# Patient Record
Sex: Male | Born: 2000 | Race: White | Hispanic: No | Marital: Single | Smoking: Never smoker
Health system: Southern US, Community
[De-identification: ages and names within clinical notes are randomized; demographics above are authoritative.]

## PROBLEM LIST (undated history)

## (undated) DIAGNOSIS — S39012A Strain of muscle, fascia and tendon of lower back, initial encounter: Secondary | ICD-10-CM

---
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
## 2023-07-18 ENCOUNTER — Emergency Department: Admit: 2023-07-18 | Payer: PRIVATE HEALTH INSURANCE

## 2023-07-18 ENCOUNTER — Inpatient Hospital Stay
Admit: 2023-07-18 | Discharge: 2023-07-18 | Disposition: A | Discharge status: Home / Self Care | Payer: PRIVATE HEALTH INSURANCE

## 2023-07-18 DIAGNOSIS — S39012A Strain of muscle, fascia and tendon of lower back, initial encounter: Secondary | ICD-10-CM

## 2023-07-18 MED ORDER — CYCLOBENZAPRINE HCL 10 MG PO TABS
10 MG | ORAL_TABLET | Freq: Three times a day (TID) | ORAL | 0 refills | Status: AC | PRN
Start: 2023-07-18 — End: ?

## 2023-07-18 MED ORDER — METHYLPREDNISOLONE ACETATE 80 MG/ML IJ SUSP
80 | Freq: Once | INTRAMUSCULAR | Status: AC
Start: 2023-07-18 — End: 2023-07-18
  Administered 2023-07-18: 14:00:00 80 mg via INTRAMUSCULAR

## 2023-07-18 MED FILL — DEPO-MEDROL 80 MG/ML IJ SUSP: 80 MG/ML | INTRAMUSCULAR | Qty: 1

## 2023-07-18 NOTE — ED Provider Notes (Signed)
[facility]  Urgent Care Encounter      CHIEF COMPLAINT       Chief Complaint   Patient presents with    Back Pain       Nurses Notes reviewed and I agree except as noted in the HPI.  HISTORY OF PRESENT ILLNESS   Raymond Jefferson is a 22 y.o. male who presents to urgent care with complaints of left lower back pain.  Patient reports he has had back issues for approximately almost 2 months and reports he has been going to the chiropractor.  Patient reports back pain did improve and he does take Tylenol and ibuprofen occasionally.  Patient reports approximately 4 to 5 days ago he was moving into an apartment and lifting heavy objects when he felt a pull in his left lower back.  Patient reports he believes he is having a flareup.  Denies loss of bowel or bladder control.  Patient reports occasionally he will have spasms or radiation of pain down his left leg.    REVIEW OF SYSTEMS     Review of Systems   Constitutional:  Negative for fatigue and fever.   Genitourinary:  Negative for dysuria and flank pain.   Musculoskeletal:  Positive for back pain.   Neurological:  Negative for dizziness.       PAST MEDICAL HISTORY   History reviewed. No pertinent past medical history.    SURGICAL HISTORY     Patient  has no past surgical history on file.    CURRENT MEDICATIONS       Previous Medications    No medications on file       ALLERGIES     Patient is has No Known Allergies.    FAMILY HISTORY     Patient'sfamily history is not on file.    SOCIAL HISTORY     Patient      PHYSICAL EXAM     ED TRIAGE VITALS  BP: (!) 140/85, Temp: 98 F (36.7 C), Pulse: 55, Respirations: 16, SpO2: 97 %  Physical Exam  Vitals and nursing note reviewed.   Constitutional:       Appearance: Normal appearance.   HENT:      Head: Normocephalic and atraumatic.   Cardiovascular:      Rate and Rhythm: Normal rate.   Pulmonary:      Effort: Pulmonary effort is normal.      Breath sounds: Normal breath sounds.   Musculoskeletal:       Lumbar back: Spasms and tenderness present.   Skin:     General: Skin is warm and dry.      Capillary Refill: Capillary refill takes less than 2 seconds.   Neurological:      General: No focal deficit present.      Mental Status: He is alert.         DIAGNOSTIC RESULTS   Labs:No results found for this visit on 07/18/23.    IMAGING:  XR LUMBAR SPINE (MIN 4 VIEWS)   Final Result    Normal lumbar spine.               **This report has been created using voice recognition software. It may contain   minor errors which are inherent in voice recognition technology.**         Electronically signed by Dr. Sherre Poot         URGENT CARE COURSE:     Vitals:    07/18/23  0957   BP: (!) 140/85   Pulse: 55   Resp: 16   Temp: 98 F (36.7 C)   SpO2: 97%       Medications   methylPREDNISolone acetate (DEPO-MEDROL) injection 80 mg (80 mg IntraMUSCular Given 07/18/23 1023)     PROCEDURES:  None  FINAL IMPRESSION      1. Strain of lumbar region, initial encounter        DISPOSITION/PLAN   DISPOSITION Decision To Discharge 07/18/2023 10:22:56 AM    X-ray reveals no acute fracture as read by the radiologist.  Patient was given Depo-Medrol injection in the urgent care to assist with inflammation.  Discussed with patient he may continue taking Tylenol and ibuprofen as needed as well as going to the chiropractor for relief.  Discussed with patient I will prescribe oral Flexeril for muscle spasms although he may not take this while driving or working.  Discussed with patient if symptoms persist and do not improve over the next 1 to 2 weeks to follow-up with his primary care provider.  Work and school slip provided per request.  Patient in agreement with this plan.    PATIENT REFERRED TO:  [facility]  [address]  [place] [address]  [phone]  Schedule an appointment as soon as possible for a visit   As needed    [facility]  [address]  [place] [address]  [phone]  Go to   Go directly to  [facility], If symptoms worsen    DISCHARGE MEDICATIONS:  New Prescriptions    CYCLOBENZAPRINE (FLEXERIL) 10 MG TABLET    Take 1 tablet by mouth 3 times daily as needed for Muscle spasms     Current Discharge Medication List          Glennis Brink, APRN - CNP            Glennis Brink, APRN - CNP  07/18/23 1046

## 2023-07-18 NOTE — ED Notes (Signed)
Pt ambulates to room 4 for c/o lower back pain x 1.5 months. Pt states he has been seen at the chiropractor which gives little relief and the pain comes back      Willette Cluster, LPN  46/96/29 [id number]

## 2024-09-09 ENCOUNTER — Inpatient Hospital Stay
Admit: 2024-09-09 | Discharge: 2024-09-09 | Disposition: A | Discharge status: Home / Self Care | Payer: PRIVATE HEALTH INSURANCE | Arrived: VH

## 2024-09-09 DIAGNOSIS — S91331A Puncture wound without foreign body, right foot, initial encounter: Secondary | ICD-10-CM

## 2024-09-09 MED ORDER — TETANUS-DIPHTH-ACELL PERTUSSIS 5-2.5-18.5 LF-MCG/0.5 IM SUSY
5-2.5-18.5 | Freq: Once | INTRAMUSCULAR | Status: AC
Start: 2024-09-09 — End: 2024-09-09
  Administered 2024-09-09: 15:00:00 0.5 mL via INTRAMUSCULAR

## 2024-09-09 MED ORDER — BACITRACIN ZINC 500 UNIT/GM EX OINT
500 | Freq: Once | CUTANEOUS | Status: AC
Start: 2024-09-09 — End: 2024-09-09
  Administered 2024-09-09: 15:00:00 via TOPICAL

## 2024-09-09 MED ORDER — MUPIROCIN 2 % EX OINT
2 | CUTANEOUS | 0 refills | 7.00000 days | Status: AC
Start: 2024-09-09 — End: 2024-09-16

## 2024-09-09 MED FILL — BACITRACIN ZINC 500 UNIT/GM EX OINT: 500 UNIT/GM | CUTANEOUS | Qty: 0.9 | Fill #0

## 2024-09-09 MED FILL — BOOSTRIX 5-2.5-18.5 LF-MCG/0.5 IM SUSY: 5-2.5-18.5 LF-MCG/0.5 | INTRAMUSCULAR | Qty: 0.5 | Fill #0
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
## 2024-09-09 NOTE — ED Triage Notes (Signed)
Injection site remains free of redness and swelling

## 2024-09-09 NOTE — Discharge Instructions (Addendum)
 Keep wound clean and dry.  Apply antibiotic ointment as directed.  Follow-up with primary care provider for any new or worsening symptoms Go to emergency department for any symptoms or concerns deemed emergent.
